# Patient Record
Sex: Female | Born: 1960 | Hispanic: Refuse to answer | Marital: Married | Smoking: Former smoker
Health system: Southern US, Community
[De-identification: ages and names within clinical notes are randomized; demographics above are authoritative.]

## PROBLEM LIST (undated history)

## (undated) DIAGNOSIS — F32A Depression, unspecified: Secondary | ICD-10-CM

## (undated) DIAGNOSIS — F329 Major depressive disorder, single episode, unspecified: Secondary | ICD-10-CM

## (undated) HISTORY — PX: TUBAL LIGATION: SHX77

## (undated) HISTORY — PX: CARPAL TUNNEL RELEASE: SHX101

## (undated) HISTORY — PX: WISDOM TOOTH EXTRACTION: SHX21

## (undated) HISTORY — DX: Depression, unspecified: F32.A

## (undated) HISTORY — DX: Major depressive disorder, single episode, unspecified: F32.9

---

## 2010-02-09 ENCOUNTER — Ambulatory Visit: Payer: Self-pay | Admitting: Family Medicine

## 2010-02-09 DIAGNOSIS — D492 Neoplasm of unspecified behavior of bone, soft tissue, and skin: Secondary | ICD-10-CM

## 2010-02-09 DIAGNOSIS — B070 Plantar wart: Secondary | ICD-10-CM

## 2010-02-09 DIAGNOSIS — D239 Other benign neoplasm of skin, unspecified: Secondary | ICD-10-CM | POA: Insufficient documentation

## 2010-02-14 ENCOUNTER — Encounter: Payer: Self-pay | Admitting: Family Medicine

## 2011-01-10 NOTE — Assessment & Plan Note (Signed)
Summary: NOV: Mole removal, cryo   Vital Signs:  Patient profile:   50 year old female Height:      65 inches Weight:      146 pounds BMI:     24.38 Temp:     98.5 degrees F oral Pulse rate:   89 / minute BP sitting:   126 / 70  (left arm) Cuff size:   regular  Vitals Entered By: Kathlene November (February 09, 2010 9:39 AM) CC: NP- bump on left cheek present for years but getting larger Is Patient Diabetic? No   Primary Care Provider:  Nani Gasser, MD  CC:  NP- bump on left cheek present for years but getting larger.  History of Present Illness: NP- bump on left cheek present for years but getting larger. Present for 2 years on er left cheek.  Occ will drain and has a odor. Occ tender.   Has 2 moles on the right side ofher face that are getting larger.She is very worried about this. Hx of previous mole bx on her breast that was benign.  no family hx of skin cancer.  No family hx of skin cancer.         Habits & Providers  Alcohol-Tobacco-Diet     Alcohol drinks/day: 0     Tobacco Status: quit     Year Quit: 15 years ago  Exercise-Depression-Behavior     Does Patient Exercise: no     STD Risk: never     Drug Use: never     Seat Belt Use: always  Current Medications (verified): 1)  Fish Oil 1000 Mg Caps (Omega-3 Fatty Acids) .... Takle One Tablet By Mouth Once A Day 2)  Multivitamins  Tabs (Multiple Vitamin) .... Take One Tablet By Mouth Once A Day 3)  Vitamin C 100 Mg Tabs (Ascorbic Acid) .... Take One Tablet By Mouth Once A Day 4)  Melatonin 5 Mg Tabs (Melatonin) .... Take One Tablet By Mouth At Bedtime  Allergies (verified): No Known Drug Allergies  Comments:  Nurse/Medical Assistant: The patient's medications and allergies were reviewed with the patient and were updated in the Medication and Allergy Lists. Kathlene November (February 09, 2010 9:42 AM)  Past History:  Past Medical History: None  Past Surgical History: Tubal ligation 98 Moed on breast  removed 2006 Hand "fiexed" 15  Family History: Father with alcoholism Aunt with colon Ca  Social History: Former Smoker Alcohol use-no Regular exercise-no Smoking Status:  quit Does Patient Exercise:  no STD Risk:  never Drug Use:  never Seat Belt Use:  always  Review of Systems       + fever/sweats/weakness, no unexplained weight loss/gain.  No vison changes.  No difficulty hearing/ringing in ears, hay fever/allergies.  No chest pain/discomfort, palpitations.  No Br lump/nipple discharge.  No cough/wheeze.  No blood in BM, nausea/vomiting/diarrhea.  No nighttime urination,+  leaking urine, no unusual vaginal bleeding, discharge (penis or vagina).  No muscle/joint pain. No rash, change in mole.  + HA, no memory loss.  No anxiety, +sleep d/o, depression.  No easy bruising/bleeding, unexplained lump   Physical Exam  General:  Well-developed,well-nourished,in no acute distress; alert,appropriate and cooperative throughout examination Head:  Normocephalic and atraumatic without obvious abnormalities. No apparent alopecia or balding. Skin:  Right side of face 0.6 x 0.7 cm brown raised smooth symmetric mole near the ear. Right below that is a 0.2 x  0.2 cm lesion that looks exactly the same. There is also  a solar lentigo that is about 0.5 x 2.0 large and is flat. On the dorum of her right hand has a 0.3 x 0.3 wart.  On the left cheek there is a soft nodule approx. 2.2 x 2.2 cm. No breaks or discoloration of the skin.  Doesn't feel cystic. Nothing on the inside of the cheek.  Psych:  Cognition and judgment appear intact. Alert and cooperative with normal attention span and concentration. No apparent delusions, illusions, hallucinations   Impression & Recommendations:  Problem # 1:  NEVUS, ATYPICAL (ICD-216.9)  Overall the lesion on her left cheek is reassuring but since it has grown quickly then I did recommend bx. Pt tolerated well and will be sent for pathology. F/U wound care reviewed.     Orders: Shave Skin Lesion 0.6-1.0cm face/ears/eyelids/nose/lips/mm (11311) Shave Skin Lesion < 0.5cm face/ears/eyelids/nose/lips/mm (11310)  Problem # 2:  NEOPLASM, SKIN, FACE (ICD-239.2)  Nodule on the left cheeck feels like a lipoma.  Doesn't feel ike a salivary gland or cyst but will refer to Dr. Arville Lime ENT for further evaluation and possibly bx or removal.   Orders: ENT Referral (ENT)  Problem # 3:  PLANTAR WART, RIGHT (ICD-078.12) Cryotherapy performed.  Orders: Cryotheraphy (1st lesion) (17000)  Complete Medication List: 1)  Fish Oil 1000 Mg Caps (Omega-3 fatty acids) .... Takle one tablet by mouth once a day 2)  Multivitamins Tabs (Multiple vitamin) .... Take one tablet by mouth once a day 3)  Vitamin C 100 Mg Tabs (Ascorbic acid) .... Take one tablet by mouth once a day 4)  Melatonin 5 Mg Tabs (Melatonin) .... Take one tablet by mouth at bedtime  Procedure Note  Mole Biopsy/Removal: Indication: changing lesion  Procedure # 1: shave biopsy    Size (in cm): 0.6 x 0.7    Region: left facial cheek     Instrument used: Double blade    Anesthesia: 1% lidocaine w/epinephrine  Procedure # 2: shave biopsy    Size (in cm): 0.2 x 0.2    Region: left facial cheek.     Instrument used: double blade     Anesthesia: 1% lidocaine w/epinephrine  Cleaned and prepped with: alcohol and betadine Wound dressing: bacitracin Additional Instructions: Aluminum chloride used for hemostasis.   Wart Removal: Indication: suspicious lesion  Procedure # 1: cryotherapy    Region: right dorsum of hand.     Comment: Pt tolerated well.

## 2011-01-10 NOTE — Consult Note (Signed)
Summary: [facility]  [facility]   Imported By: Lanelle Bal 06/09/2010 08:29:44  _____________________________________________________________________  External Attachment:    Type:   Image     Comment:   External Document

## 2018-03-08 ENCOUNTER — Ambulatory Visit (INDEPENDENT_AMBULATORY_CARE_PROVIDER_SITE_OTHER): Payer: BLUE CROSS/BLUE SHIELD

## 2018-03-08 ENCOUNTER — Ambulatory Visit (INDEPENDENT_AMBULATORY_CARE_PROVIDER_SITE_OTHER): Payer: BLUE CROSS/BLUE SHIELD | Admitting: Family Medicine

## 2018-03-08 ENCOUNTER — Encounter: Payer: Self-pay | Admitting: Family Medicine

## 2018-03-08 VITALS — BP 125/73 | HR 86 | Wt 130.0 lb

## 2018-03-08 DIAGNOSIS — R519 Headache, unspecified: Secondary | ICD-10-CM | POA: Insufficient documentation

## 2018-03-08 DIAGNOSIS — G43909 Migraine, unspecified, not intractable, without status migrainosus: Secondary | ICD-10-CM | POA: Diagnosis not present

## 2018-03-08 DIAGNOSIS — M542 Cervicalgia: Secondary | ICD-10-CM

## 2018-03-08 DIAGNOSIS — R51 Headache: Secondary | ICD-10-CM

## 2018-03-08 DIAGNOSIS — F4321 Adjustment disorder with depressed mood: Secondary | ICD-10-CM

## 2018-03-08 MED ORDER — FLUTICASONE PROPIONATE 50 MCG/ACT NA SUSP: 2.0000 | Freq: Every day | NASAL | 6 refills | Status: AC

## 2018-03-08 MED ORDER — PREDNISONE 10 MG PO TABS: ORAL_TABLET | ORAL | 0 refills | Status: DC

## 2018-03-08 MED ORDER — TOPIRAMATE 25 MG PO TABS: ORAL_TABLET | ORAL | 0 refills | Status: AC

## 2018-03-08 MED ORDER — RIZATRIPTAN BENZOATE 10 MG PO TBDP: 10.0000 mg | ORAL_TABLET | ORAL | 1 refills | Status: AC | PRN

## 2018-03-08 NOTE — Patient Instructions (Signed)
Migraine Headache A migraine headache is a very strong throbbing pain on one side or both sides of your head. Migraines can also cause other symptoms. Talk with your doctor about what things may bring on (trigger) your migraine headaches. Follow these instructions at home: Medicines  Take over-the-counter and prescription medicines only as told by your doctor.  Do not drive or use heavy machinery while taking prescription pain medicine.  To prevent or treat constipation while you are taking prescription pain medicine, your doctor may recommend that you: ? Drink enough fluid to keep your pee (urine) clear or pale yellow. ? Take over-the-counter or prescription medicines. ? Eat foods that are high in fiber. These include fresh fruits and vegetables, whole grains, and beans. ? Limit foods that are high in fat and processed sugars. These include fried and sweet foods. Lifestyle  Avoid alcohol.  Do not use any products that contain nicotine or tobacco, such as cigarettes and e-cigarettes. If you need help quitting, ask your doctor.  Get at least 8 hours of sleep every night.  Limit your stress. General instructions   Keep a journal to find out what may bring on your migraines. For example, write down: ? What you eat and drink. ? How much sleep you get. ? Any change in what you eat or drink. ? Any change in your medicines.  If you have a migraine: ? Avoid things that make your symptoms worse, such as bright lights. ? It may help to lie down in a dark, quiet room. ? Do not drive or use heavy machinery. ? Ask your doctor what activities are safe for you.  Keep all follow-up visits as told by your doctor. This is important. Contact a doctor if:  You get a migraine that is different or worse than your usual migraines. Get help right away if:  Your migraine gets very bad.  You have a fever.  You have a stiff neck.  You have trouble seeing.  Your muscles feel weak or like you  cannot control them.  You start to lose your balance a lot.  You start to have trouble walking.  You pass out (faint). This information is not intended to replace advice given to you by your health care provider. Make sure you discuss any questions you have with your health care provider. Document Released: 09/05/2008 Document Revised: 06/16/2016 Document Reviewed: 05/15/2016 Elsevier Interactive Patient Education  2018 [employer] Occipital Neuralgia Occipital neuralgia is a type of headache that causes episodes of very bad pain in the back of your head. Pain from occipital neuralgia may spread (radiate) to other parts of your head. The pain is usually brief and often goes away after you rest and relax. These headaches may be caused by irritation of the nerves that leave your spinal cord high up in your neck, just below the base of your skull (occipital nerves). Your occipital nerves transmit sensations from the back of your head, the top of your head, and the areas behind your ears. What are the causes? Occipital neuralgia can occur without any known cause (primary headache syndrome). In other cases, occipital neuralgia is caused by pressure on or irritation of one of the two occipital nerves. Causes of occipital nerve compression or irritation include:  Wear and tear of the vertebrae in the neck (osteoarthritis).  Neck injury.  Disease of the disks that separate the vertebrae.  Tumors.  Gout.  Infections.  Diabetes.  Swollen blood vessels that put pressure on the   occipital nerves.  Muscle spasm in the neck.  What are the signs or symptoms? Pain is the main symptom of occipital neuralgia. It usually starts in the back of the head but may also be felt in other areas supplied by the occipital nerves. Pain is usually on one side but may be on both sides. You may have:  Brief episodes of very bad pain that is burning, stabbing, shocking, or shooting.  Pain behind the  eye.  Pain triggered by neck movement or hair brushing.  Scalp tenderness.  Aching in the back of the head between episodes of very bad pain.  How is this diagnosed? Your health care provider may diagnose occipital neuralgia based on your symptoms and a physical exam. During the exam, the health care provider may push on areas supplied by the occipital nerves to see if they are painful. Some tests may also be done to help in making the diagnosis. These may include:  Imaging studies of the upper spinal cord, such as an MRI or CT scan. These may show compression or spinal cord abnormalities.  Nerve block. You will get an injection of numbing medicine (local anesthetic) near the occipital nerve to see if this relieves pain.  How is this treated? Treatment may begin with simple measures, such as:  Rest.  Massage.  Heat.  Over-the-counter pain relievers.  If these measures do not work, you may need other treatments, including:  Medicines such as: ? Prescription-strength anti-inflammatory medicines. ? Muscle relaxants. ? Antiseizure medicines. ? Antidepressants.  Steroid injection. This involves injections of local anesthetic and strong anti-inflammatory drugs (steroids).  Pulsed radiofrequency. Wires are implanted to deliver electrical impulses that block pain signals from the occipital nerve.  Physical therapy.  Surgery to relieve nerve pressure.  Follow these instructions at home:  Take all medicines as directed by your health care provider.  Avoid activities that cause pain.  Rest when you have an attack of pain.  Try gentle massage or a heating pad to relieve pain.  Work with a physical therapist to learn stretching exercises you can do at home.  Try a different pillow or sleeping position.  Practice good posture.  Try to stay active. Get regular exercise that does not cause pain. Ask your health care provider to suggest safe exercises for you.  Keep all  follow-up visits as directed by your health care provider. This is important. Contact a health care provider if:  Your medicine is not working.  You have new or worsening symptoms. Get help right away if:  You have very bad head pain that is not going away.  You have a sudden change in vision, balance, or speech. This information is not intended to replace advice given to you by your health care provider. Make sure you discuss any questions you have with your health care provider. Document Released: 11/21/2001 Document Revised: 05/04/2016 Document Reviewed: 11/19/2013 Elsevier Interactive Patient Education  2017 Elsevier Inc.  

## 2018-03-08 NOTE — Progress Notes (Signed)
Subjective:    Patient ID: Julie Pitts, female    DOB: May 12, 1961, 57 y.o.   MRN: [id number]  HPI 57 year old female comes in today to reestablish care.  I have not seen her since 2011.  But she also has some specific concerns going on including headaches and dizziness.  Julie Pitts is a 57 year old female who complains that she has had severe headaches for about 7 years.  She never really had any major problems with this before hand but around that time started having a lot of persistent and frequent headaches.  Some of them are more low-grade and more frequent and others are severe to the point that she gets light sensitivity, sound sensitivity and actually vomits.  She says she almost gets dizzy with them and has difficulty walking when they are that severe.  She stated she initially went to a dentist when they first started and thought it was coming from impaction of her wisdom teeth.  She had those removed and did not get any relief with her headaches.  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  She said she finally got to the point where the chiropractor would no longer treat her because he was afraid that he was going to cause more harm.  She frequently takes an over-the-counter Advil allergy and sinus tab.  The last severe headache that she had with vomiting was actually yesterday.  She says at this point she just feels overwhelmed and frustrated.  She feels sad and down about it and at times even hopeless.  Is really starting to impact her emotionally.  She is here with her husband today.  She also reports significant difficulty with sleep.   Review of Systems  Constitutional: Negative for diaphoresis, fever and  unexpected weight change.  HENT: Negative for hearing loss, postnasal drip, sneezing and tinnitus.   Eyes: Negative for visual disturbance.  Respiratory: Negative for cough and wheezing.   Cardiovascular: Negative for chest pain and palpitations.  Gastrointestinal: Positive for nausea and vomiting.  Genitourinary: Negative for vaginal bleeding and vaginal discharge.  Musculoskeletal: Negative for arthralgias.  Neurological: Positive for dizziness and headaches.       + headaches.   Hematological: Negative for adenopathy. Does not bruise/bleed easily.     BP 125/73   Pulse 86   Wt 130 lb (59 kg)   BMI 21.63 kg/m     No Known Allergies  Past Medical History:  Diagnosis Date  . Depression     Past Surgical History:  Procedure Laterality Date  . CARPAL TUNNEL RELEASE    . TUBAL LIGATION    . WISDOM TOOTH EXTRACTION      Social History   Socioeconomic History  . Marital status: Married    Spouse name: Kellene Mccleary  . Number of children: 1  . Years of education: 41  . Highest education level: High school graduate  Occupational History  . Occupation: stay at home  Social Needs  . Financial resource strain: Not on file  . Food insecurity:    Worry: Not on file    Inability: Not on file  . Transportation needs:    Medical: Not on file    Non-medical:  Not on file  Tobacco Use  . Smoking status: Former Smoker    Packs/day: 1.00    Years: 20.00    Pack years: 20.00    Types: Cigarettes    Last attempt to quit: 03/08/1998    Years since quitting: 20.0  . Smokeless tobacco: Never Used  Substance and Sexual Activity  . Alcohol use: Never    Frequency: Never  . Drug use: Never  . Sexual activity: Not Currently    Partners: Male    Birth control/protection: Surgical  Lifestyle  . Physical activity:    Days per week: Not on file    Minutes per session: Not on file  . Stress: Not on file  Relationships  . Social connections:    Talks on phone: Not on file     Gets together: Not on file    Attends religious service: Not on file    Active member of club or organization: Not on file    Attends meetings of clubs or organizations: Not on file    Relationship status: Not on file  . Intimate partner violence:    Fear of current or ex partner: Not on file    Emotionally abused: Not on file    Physically abused: Not on file    Forced sexual activity: Not on file  Other Topics Concern  . Not on file  Social History Narrative  . Not on file    Family History  Problem Relation Age of Onset  . Colon cancer Mother     Outpatient Encounter Medications as of 03/08/2018  Medication Sig  . fluticasone (FLONASE) 50 MCG/ACT nasal spray Place 2 sprays into both nostrils daily.  . predniSONE (DELTASONE) 10 MG tablet 8 tabs po Day 1, 6 tabs Day 2, 4 tabs Day 3, 2 tabs Day , 1 tab Day 5  . rizatriptan (MAXALT-MLT) 10 MG disintegrating tablet Take 1 tablet (10 mg total) by mouth as needed for migraine. May repeat in 2 hours if needed  . topiramate (TOPAMAX) 25 MG tablet 25 mg po QHS x 1 week, then increase to BID x 1 week, then 2 in AM and 1 QHS x 1 week, then 2 tabs po BID   No facility-administered encounter medications on file as of 03/08/2018.          Objective:   Physical Exam  Constitutional: She is oriented to person, place, and time. She appears well-developed and well-nourished.  HENT:  Head: Normocephalic and atraumatic.  Right Ear: External ear normal.  Left Ear: External ear normal.  Nose: Nose normal.  Mouth/Throat: Oropharynx is clear and moist.  TMs and canals are clear.   Eyes: Pupils are equal, round, and reactive to light. Conjunctivae and EOM are normal.  Neck: Neck supple. No thyromegaly present.  Cardiovascular: Normal rate, regular rhythm and normal heart sounds.  Pulmonary/Chest: Effort normal and breath sounds normal. She has no wheezes.  Musculoskeletal:  Normal cervical flexion, some decreased extension.  She is able to  rotate right and left but it was painful.  She is also able to side bend right and left but that was also painful.  Lymphadenopathy:    She has no cervical adenopathy.  Neurological: She is alert and oriented to person, place, and time. No cranial nerve deficit.  Skin: Skin is warm and dry.  Psychiatric: She has a normal mood and affect. Her behavior is normal.          Assessment & Plan:  Headaches-I do feel like she has 2 different types of headaches going on.  I feel like she has migraine headaches which are the more severe headaches with vomiting and light sensitivity and sound sensitivity.  She moves all at the criteria for diagnosis.  I think she will benefit from starting on a prophylaxis in addition to having a rescue medication we discussed this today.  I am going to start her on topiramate since she tends to have low blood pressure.  And will give her Maxalt for rescue.  I also feel that she has chronic daily headaches as well some heavy with topiramate will help with that as well but we will need to monitor that.  We also discussed the importance of stopping some of the over-the-counter medications and says the sinus allergy medicine which she takes very frequently in fact almost every day.  I discussed that this can often trigger a rebound phenomenon and that she will have to stop taking that medication.  In the interim I put her on a prednisone taper for 5 days to try to break her current headache cycle that she is in.  We also discussed further evaluation with brain MRI since it is a little unusual to start having severe and frequent headaches postmenopausally.  Cervical pain-recommend starting with an x-ray to evaluate for degenerative disc disease as well as some significant arthritis on exam.  We may need to evaluate further but I think this will be a great start.  She would likely benefit from some physical therapy.  Positive depression screen-PHQ 9 score of 17 and gad 7 score of  16.  She did mark that she was having thoughts of being better off dead but denies being actively suicidal.  She says she really just feeling overwhelmed with the headaches and really wants help.

## 2018-03-09 LAB — CBC WITH DIFFERENTIAL/PLATELET
BASOS PCT: 0.8 %
Basophils Absolute: 38 cells/uL (ref 0–200)
EOS ABS: 72 {cells}/uL (ref 15–500)
Eosinophils Relative: 1.5 %
HCT: 41.9 % (ref 35.0–45.0)
Hemoglobin: 14.8 g/dL (ref 11.7–15.5)
Lymphs Abs: 2362 cells/uL (ref 850–3900)
MCH: 32.5 pg (ref 27.0–33.0)
MCHC: 35.3 g/dL (ref 32.0–36.0)
MCV: 92.1 fL (ref 80.0–100.0)
MPV: 10.3 fL (ref 7.5–12.5)
Monocytes Relative: 12.4 %
Neutro Abs: 1733 cells/uL (ref 1500–7800)
Neutrophils Relative %: 36.1 %
PLATELETS: 265 10*3/uL (ref 140–400)
RBC: 4.55 10*6/uL (ref 3.80–5.10)
RDW: 12.2 % (ref 11.0–15.0)
Total Lymphocyte: 49.2 %
WBC mixed population: 595 cells/uL (ref 200–950)
WBC: 4.8 10*3/uL (ref 3.8–10.8)

## 2018-03-09 LAB — COMPLETE METABOLIC PANEL WITH GFR
AG Ratio: 2 (calc) (ref 1.0–2.5)
ALBUMIN MSPROF: 4.9 g/dL (ref 3.6–5.1)
ALKALINE PHOSPHATASE (APISO): 70 U/L (ref 33–130)
ALT: 29 U/L (ref 6–29)
AST: 32 U/L (ref 10–35)
BUN / CREAT RATIO: 40 (calc) — AB (ref 6–22)
BUN: 27 mg/dL — AB (ref 7–25)
CALCIUM: 10.3 mg/dL (ref 8.6–10.4)
CO2: 31 mmol/L (ref 20–32)
CREATININE: 0.68 mg/dL (ref 0.50–1.05)
Chloride: 103 mmol/L (ref 98–110)
GFR, EST NON AFRICAN AMERICAN: 96 mL/min/{1.73_m2} (ref >=60)
GFR, Est African American: 112 mL/min/{1.73_m2} (ref >=60)
GLUCOSE: 97 mg/dL (ref 65–99)
Globulin: 2.4 g/dL (calc) (ref 1.9–3.7)
Potassium: 3.7 mmol/L (ref 3.5–5.3)
Sodium: 141 mmol/L (ref 135–146)
Total Bilirubin: 0.4 mg/dL (ref 0.2–1.2)
Total Protein: 7.3 g/dL (ref 6.1–8.1)

## 2018-03-09 LAB — SEDIMENTATION RATE: Sed Rate: 14 mm/h (ref 0–30)

## 2018-03-09 LAB — LIPID PANEL
CHOL/HDL RATIO: 4.1 (calc) (ref <5.0)
CHOLESTEROL: 238 mg/dL — AB (ref <200)
HDL: 58 mg/dL (ref >50)
LDL CHOLESTEROL (CALC): 156 mg/dL — AB
Non-HDL Cholesterol (Calc): 180 mg/dL (calc) — ABNORMAL HIGH (ref <130)
Triglycerides: 122 mg/dL (ref <150)

## 2018-03-09 LAB — TSH: TSH: 1.34 mIU/L (ref 0.40–4.50)

## 2018-03-12 ENCOUNTER — Encounter: Payer: Self-pay | Admitting: Sports Medicine

## 2018-03-12 ENCOUNTER — Ambulatory Visit (INDEPENDENT_AMBULATORY_CARE_PROVIDER_SITE_OTHER): Payer: BLUE CROSS/BLUE SHIELD | Admitting: Sports Medicine

## 2018-03-12 DIAGNOSIS — M4722 Other spondylosis with radiculopathy, cervical region: Secondary | ICD-10-CM | POA: Diagnosis not present

## 2018-03-12 DIAGNOSIS — M47812 Spondylosis without myelopathy or radiculopathy, cervical region: Secondary | ICD-10-CM | POA: Insufficient documentation

## 2018-03-12 MED ORDER — MELOXICAM 15 MG PO TABS: ORAL_TABLET | ORAL | 3 refills | Status: AC

## 2018-03-12 MED ORDER — KETOROLAC TROMETHAMINE 30 MG/ML IJ SOLN
30.0000 mg | Freq: Once | INTRAMUSCULAR | Status: AC
  Administered 2018-03-12: 30 mg via INTRAMUSCULAR

## 2018-03-12 MED ORDER — METHYLPREDNISOLONE SODIUM SUCC 125 MG IJ SOLR
125.0000 mg | Freq: Once | INTRAMUSCULAR | Status: AC
  Administered 2018-03-12: 125 mg via INTRAMUSCULAR

## 2018-03-12 MED ORDER — CYCLOBENZAPRINE HCL 10 MG PO TABS: ORAL_TABLET | ORAL | 0 refills | Status: AC

## 2018-03-12 NOTE — Assessment & Plan Note (Signed)
With left-sided C7 radiculitis. Formal physical therapy, Flexeril, meloxicam. Toradol 30, Solu-Medrol 125 intramuscular.  Return in one month, MRI for interventional planning if no better.

## 2018-03-12 NOTE — Progress Notes (Signed)
.   Subjective:    I'm seeing this patient as a consultation for:  Dr. Beatrice Lecher  CC: worsening migraine pain  HPI:  Julie Pitts is 57yo woman with a pmh significant for 7 years of migraine pain for which she managed with chiropractic maneuvers for the first 5 years. Pt also reports unsuccessful  use of acupuncture to alleviate the pain during the same time period.  Despite chiropractic management, frequency and intensity of the migraine pain increased. Pt reports that in the last month she has spent approximately 1 day without pain.   In the last week, pt endorses accompanying nausea, vomiting, dizziness,  and throbbing pain over the maxillary, frontal  and ethmoid sinus. In the last week, pt also reports cold-like symptoms (congestion) for which  She saw her pcp and received treatment (nasal corticosteroid).  Pt also reports aching/throbing pain over the lower cervical spine, that radiates to the the  left shoulder, and up to the external occipital protuberance. Pt reports a general aversion to being on medication, and that the migraine medication she was prescribed by her PCP has not brought relief. Pt reports that the pain has kept her from sleeping.    I reviewed the past medical history, family history, social history, surgical history, and allergies today and no changes were needed.  Please see the problem list section below in epic for further details.  Past Medical History: Past Medical History:  Diagnosis Date  . Depression    Past Surgical History: Past Surgical History:  Procedure Laterality Date  . CARPAL TUNNEL RELEASE    . TUBAL LIGATION    . WISDOM TOOTH EXTRACTION     Social History: Social History   Socioeconomic History  . Marital status: Married    Spouse name: Shanan Fitzpatrick  . Number of children: 1  . Years of education: 27  . Highest education level: High school graduate  Occupational History  . Occupation: stay at home  Social Needs  . Financial  resource strain: Not on file  . Food insecurity:    Worry: Not on file    Inability: Not on file  . Transportation needs:    Medical: Not on file    Non-medical: Not on file  Tobacco Use  . Smoking status: Former Smoker    Packs/day: 1.00    Years: 20.00    Pack years: 20.00    Types: Cigarettes    Last attempt to quit: 03/08/1998    Years since quitting: 20.0  . Smokeless tobacco: Never Used  Substance and Sexual Activity  . Alcohol use: Never    Frequency: Never  . Drug use: Never  . Sexual activity: Not Currently    Partners: Male    Birth control/protection: Surgical  Lifestyle  . Physical activity:    Days per week: Not on file    Minutes per session: Not on file  . Stress: Not on file  Relationships  . Social connections:    Talks on phone: Not on file    Gets together: Not on file    Attends religious service: Not on file    Active member of club or organization: Not on file    Attends meetings of clubs or organizations: Not on file    Relationship status: Not on file  Other Topics Concern  . Not on file  Social History Narrative  . Not on file   Family History: Family History  Problem Relation Age of Onset  . Colon  cancer Mother    Allergies: No Known Allergies Medications: Pt endorses taking ma multi vitamin tablet, Vitamin C, fish oil, and melatonin regularly.  Also See med rec.  Review of Systems:  No visual changes, diarrhea, constipation, dizziness, abdominal pain, skin rash, fevers, chills, night sweats, weight loss, swollen lymph nodes, body aches, joint swelling, chest pain, shortness of breath, mood changes, visual or auditory hallucinations.   Objective:   General: Well Developed, well nourished, and in no acute distress.  Neuro:  Extra-ocular muscles intact, able to move all 4 extremities, sensation grossly intact.  Deep tendon reflexes tested were normal. Psych: Alert and oriented, mood congruent with affect. ENT:  Ears and nose appear  unremarkable.  Hearing grossly normal. Neck: Unremarkable overall appearance, trachea midline.  No visible thyroid enlargement. Eyes: Conjunctivae and lids appear unremarkable.  Pupils equal and round. Skin: Warm and dry, no rashes noted.  Cardiovascular: Pulses palpable, no extremity edema.  Neck: Inspection unremarkable. No palpable step off Positive Left Spurling's maneuver. Full neck range of motion with pain during lateral bending, and rotation Grip strength and sensation normal in bilateral hands Strength diminished C4 to T1 distribution No sensory change to C4 to T1 Negative Hoffman sign bilaterally Reflexes normal in triceps, biceps, and brachioradialis bilaterally.   Pt is actively experiencing migraine pain and has point tenderness reproducible with palpation at the lower cervical spine that radiates to the shoulders (left more than right), and  external occipital protuberance.    Impression and Recommendations:   This case required medical decision making of moderate complexity.    Assessment: Cervical Radiculopathy 1. Corticosteroid/NSAID (Toradol-30; Solu-Medrol 125) intramuscular injection for immediate relief given in office; 2.  Muscle relaxer (Flexeril) prescribed, for use nightly, to relieve  symptoms before sleeping;   3. Anti-Inflammatory (Meloxican) taken daily, to alleviate persistent inflammation/aggravation of area surrounding  cervical spine, and  4. Pt was encouraged to engage in nightly physical/rehabilitative  therapy for long term alleviation of cervical pain and to maintain range of motion.    Pt was counseled to follow up in one month if pain has not improved. Pt also counseled about the use of MRI to further evaluate cervical pain if the pain has not improved in one month.  Assessment: Migraine Pt counseled to continue taking migraine medication to manage headaches.  ___________________________________________ Gwen Her. Dianah Field, M.D., ABFM.,  CAQSM. Primary Care and [place] Instructor of [place] [facility]

## 2018-03-21 ENCOUNTER — Ambulatory Visit (INDEPENDENT_AMBULATORY_CARE_PROVIDER_SITE_OTHER): Payer: BLUE CROSS/BLUE SHIELD | Admitting: Physical Therapy

## 2018-03-21 ENCOUNTER — Encounter: Payer: Self-pay | Admitting: Physical Therapy

## 2018-03-21 DIAGNOSIS — M5412 Radiculopathy, cervical region: Secondary | ICD-10-CM

## 2018-03-21 DIAGNOSIS — M62838 Other muscle spasm: Secondary | ICD-10-CM | POA: Diagnosis not present

## 2018-03-21 DIAGNOSIS — R293 Abnormal posture: Secondary | ICD-10-CM | POA: Diagnosis not present

## 2018-03-21 NOTE — Patient Instructions (Addendum)
Head Press With [place] chin SLIGHTLY toward chest, keep mouth closed. Feel weight on back of head. Increase weight by pressing head down. Hold _2-3__ seconds. Relax. Repeat _5-10__ times. Once a day.  Surface: floor   Shoulder Press    Press both shoulders down. Hold __2-3_ seconds. Repeat _5-10__ times. Press one shoulder down. Hold _2-3__ seconds Repeat _5-10__ times. Do other shoulder. If unable to press one or both shoulders, lie in position a few sessions until you can. Once a day.  Surface: floor   Thoracic Self-Mobilization Stretch (Supine)    With small rolled towel at lower ribs level, gently lie back until stretch is felt. Hold __30__ seconds. Relax. Repeat __1__ times per set. Do __1__ sets per session. Do ____ sessions per day. Scoot bottom down two - three times.    Trigger Point Dry Needling  . What is Trigger Point Dry Needling (DN)? o DN is a physical therapy technique used to treat muscle pain and dysfunction. Specifically, DN helps deactivate muscle trigger points (muscle knots).  o A thin filiform needle is used to penetrate the skin and stimulate the underlying trigger point. The goal is for a local twitch response (LTR) to occur and for the trigger point to relax. No medication of any kind is injected during the procedure.   . What Does Trigger Point Dry Needling Feel Like?  o The procedure feels different for each individual patient. Some patients report that they do not actually feel the needle enter the skin and overall the process is not painful. Very mild bleeding may occur. However, many patients feel a deep cramping in the muscle in which the needle was inserted. This is the local twitch response.   Marland Kitchen How Will I feel after the treatment? o Soreness is normal, and the onset of soreness may not occur for a few hours. Typically this soreness does not last longer than two days.  o Bruising is uncommon, however; ice can be used to decrease any possible  bruising.  o In rare cases feeling tired or nauseous after the treatment is normal. In addition, your symptoms may get worse before they get better, this period will typically not last longer than 24 hours.   . What Can I do After My Treatment? o Increase your hydration by drinking more water for the next 24 hours. o You may place ice or heat on the areas treated that have become sore, however, do not use heat on inflamed or bruised areas. Heat often brings more relief post needling. o You can continue your regular activities, but vigorous activity is not recommended initially after the treatment for 24 hours. o DN is best combined with other physical therapy such as strengthening, stretching, and other therapies.   TENS UNIT: This is helpful for muscle pain and spasm.   Search and Purchase a TENS 7000 2nd edition at www.tenspros.com. It should be less than $30.     TENS unit instructions: Do not shower or bathe with the unit on Turn the unit off before removing electrodes or batteries If the electrodes lose stickiness add a drop of water to the electrodes after they are disconnected from the unit and place on plastic sheet. If you continued to have difficulty, call the TENS unit company to purchase more electrodes. Do not apply lotion on the skin area prior to use. Make sure the skin is clean and dry as this will help prolong the life of the electrodes.  After use, always check skin for unusual red areas, rash or other skin difficulties. If there are any skin problems, does not apply electrodes to the same area. Never remove the electrodes from the unit by pulling the wires. Do not use the TENS unit or electrodes other than as directed. Do not change electrode placement without consultating your therapist or physician. Keep 2 fingers with between each electrode. Wear time ratio is 2:1, on to off times.    For example on for 30 minutes off for 15 minutes and then on for 30 minutes off for  15 minutes

## 2018-03-21 NOTE — Therapy (Addendum)
[facility] [address] [place], [place], [address] Phone: [phone]   Fax:  [phone]  Physical Therapy Evaluation  Patient Details  Name: Julie Pitts MRN: [id number] Date of Birth: 08/10/1961 Referring Provider: Dr Dianah Field   Encounter Date: 03/21/2018  PT End of Session - 03/21/18 1146    Visit Number  1    Number of Visits  6    Date for PT Re-Evaluation  05/02/18    PT Start Time  1148    PT Stop Time  1251    PT Time Calculation (min)  63 min    Activity Tolerance  Patient tolerated treatment well       Past Medical History:  Diagnosis Date  . Depression     Past Surgical History:  Procedure Laterality Date  . CARPAL TUNNEL RELEASE    . TUBAL LIGATION    . WISDOM TOOTH EXTRACTION      There were no vitals filed for this visit.   Subjective Assessment - 03/21/18 1148    Subjective  Pt reports she has had HA for about 7 yrs, was seen by a chiropractor and stopped about 2 yrs ago without help. Has been continuing with her streches.  Also failed acupunture.  Currenlty has constant headache., Starting the the mid thoracic area and moves up her body into the head. Today is left side however it moves around.     Diagnostic tests  x-rays negative, cervical spondylosis.     Patient Stated Goals  reduce the pain/headaches    Currently in Pain?  Yes    Pain Score  2     Pain Location  Head    Pain Orientation  Left    Pain Descriptors / Indicators  Pressure feels like the muscle  ( upper traps) are trying to pinch her head off    Pain Type  Chronic pain    Pain Onset  More than a month ago    Pain Frequency  Constant    Aggravating Factors   nothing it is constant    Pain Relieving Factors  ice         [facility] PT Assessment - 03/21/18 0001      Assessment   Medical Diagnosis  Cervical radiculopahty    Referring Provider  Dr Dianah Field    Onset Date/Surgical Date  03/22/11    Hand Dominance   Right    Next MD Visit  unsure    Prior Therapy  no physical therapy      Precautions   Precautions  None      Balance Screen   Has the patient fallen in the past 6 months  No      Prior Function   Level of Independence  Independent    Vocation  Unemployed due to pain, was a landscaper      Observation/Other Assessments   Focus on Therapeutic Outcomes (FOTO)   79% limited      Posture/Postural Control   Posture/Postural Control  Postural limitations    Postural Limitations  Rounded Shoulders;Forward head;Increased lumbar lordosis      ROM / Strength   AROM / PROM / Strength  AROM;Strength      AROM   AROM Assessment Site  Shoulder;Cervical    Right/Left Shoulder  -- WNL with tightness in the neck    Cervical Flexion  59    Cervical Extension  35 with tightness    Cervical - Right Side Bend  30    Cervical - Left Side Bend  26    Cervical - Right Rotation  62    Cervical - Left Rotation  45      Strength   Strength Assessment Site  Shoulder;Elbow    Right/Left Shoulder  -- WNL    Right/Left Elbow  -- WNL      Palpation   Spinal mobility  hypomobile in cervical and thoracic spine.  Pt reports tightness in cervical and pain in thoracic area.     Palpation comment  tight in bilat upper traps, cervical paraspinals  and peri occiput                Objective measurements completed on examination: See above findings.      [facility] Adult PT Treatment/Exercise - 03/21/18 0001      Exercises   Exercises  Other Exercises    Other Exercises   attempted head presses into pillow - pt reports to painful, performed 10 reps shoulder presses then thoracic openers over a pool noodle.       Modalities   Modalities  Electrical Stimulation;Moist Heat      Moist Heat Therapy   Number Minutes Moist Heat  20 Minutes    Moist Heat Location  Cervical      Electrical Stimulation   Electrical Stimulation Location  cervical     Electrical Stimulation Action  IFC    Electrical  Stimulation Parameters   to tolerance    Electrical Stimulation Goals  Tone;Pain      Manual Therapy   Manual Therapy  Soft tissue mobilization    Soft tissue mobilization  STM to cervical paraspinals and perioccipital muscles.        Trigger Point Dry Needling - 03/21/18 1253    Consent Given?  Yes    Education Handout Provided  Yes    Muscles Treated Upper Body  Longissimus;Oblique capitus    Oblique Capitus Response  -- attempted on Lt side, pt unable to tolerate    Longissimus Response  Palpable increased muscle length;Twitch response elicited bilat cervical paraspinals.            PT Education - 03/21/18 1228    Education provided  Yes    Education Details  HEP, TENS & DN    Person(s) Educated  Patient    Methods  Explanation;Demonstration;Handout    Comprehension  Returned demonstration;Verbalized understanding          PT Long Term Goals - 03/21/18 1247      PT LONG TERM GOAL #1   Title  I with advanced HEP ( 05/02/18)     Time  6    Period  Weeks    Status  New      PT LONG TERM GOAL #2   Title  improve cervical ROM to WNL without feelings of tightness  ( 05/02/18)     Time  6    Period  Weeks    Status  New      PT LONG TERM GOAL #3   Title  improve FOTO =/< 60% limited ( 05/02/18)     Time  6    Period  Weeks    Status  New      PT LONG TERM GOAL #4   Title  report =/> 75% reduction in headaches ( 05/02/18)     Time  6    Period  Weeks    Status  New  PT LONG TERM GOAL #5   Title  report ability to sleep several hours without waking due to pain ( 05/02/18)     Time  6    Period  Weeks    Status  New             Plan - 03/21/18 1240    Clinical Impression Statement  57 yo female with yrs long h/o HA, neck and thoracic pain.  She has failed chiropractic care and has never tried PT.  [name] has a lot of tightness in her cervical musculature, hypomobile through the C & T spine.  She also has postural changes effecting the muscles and  spine.  Overall her stength if good.  She should do well with manual work/DN and postural re-ed.     Clinical Presentation  Evolving    Clinical Decision Making  Moderate    Rehab Potential  Excellent    PT Frequency  2x / week    PT Duration  4 weeks    PT Treatment/Interventions  Iontophoresis 84m/ml Dexamethasone;Neuromuscular re-education;Dry needling;Manual techniques;Moist Heat;Traction;Therapeutic activities;Patient/family education;Taping;Therapeutic exercise;Cryotherapy;Electrical Stimulation;Passive range of motion    PT Next Visit Plan  assess response to DN, continue with this and manual work/spinal mobs.  Postural re-ed exercises.     Consulted and Agree with Plan of Care  Patient       Patient will benefit from skilled therapeutic intervention in order to improve the following deficits and impairments:  Pain, Improper body mechanics, Postural dysfunction, Increased muscle spasms, Decreased strength, Decreased range of motion, Impaired UE functional use  Visit Diagnosis: Radiculopathy, cervical region - Plan: PT plan of care cert/re-cert  Other muscle spasm - Plan: PT plan of care cert/re-cert  Abnormal posture - Plan: PT plan of care cert/re-cert     Problem List Patient Active Problem List   Diagnosis Date Noted  . Cervical spondylosis 03/12/2018  . Chronic daily headache 03/08/2018  . Severe headache 03/08/2018  . Cervical pain 03/08/2018  . Situational depression 03/08/2018  . PLANTAR WART, RIGHT 02/09/2010  . NEVUS, ATYPICAL 02/09/2010  . NEOPLASM, SKIN, FACE 02/09/2010    SJeral PinchPT  03/21/2018, 12:55 PM  [facility] [place] [address] [address]Phone: [phone]  Fax:  [phone] Name: DAnahis FurgesonMRN: [id number] of Birth: 7May 02, 1962  PHYSICAL THERAPY DISCHARGE SUMMARY  Visits from Start of Care: 1  Current functional level related to goals / functional  outcomes: Unknown, patient reports she doesn't want to come to therapy because she is " not feeling it"   Remaining deficits: unknown   Education / Equipment: Initial HEP , DN Plan: Patient agrees to discharge.  Patient goals were not met. Patient is being discharged due to the patient's request.  ?????    SJeral Pinch PT 04/23/18 9:42 AM

## 2018-03-26 ENCOUNTER — Encounter: Payer: BLUE CROSS/BLUE SHIELD | Admitting: Physical Therapy

## 2018-03-27 ENCOUNTER — Ambulatory Visit: Payer: BLUE CROSS/BLUE SHIELD | Admitting: Family Medicine

## 2018-04-02 ENCOUNTER — Encounter: Payer: BLUE CROSS/BLUE SHIELD | Admitting: Physical Therapy

## 2019-10-01 IMAGING — DX DG CERVICAL SPINE COMPLETE 4+V
6 series · 6 of 6 positions shown · non-contrast
Comparison: None.

CLINICAL DATA: Chronic migraines.

EXAM:
CERVICAL SPINE - COMPLETE 4+ VIEW

[c-spine lat]
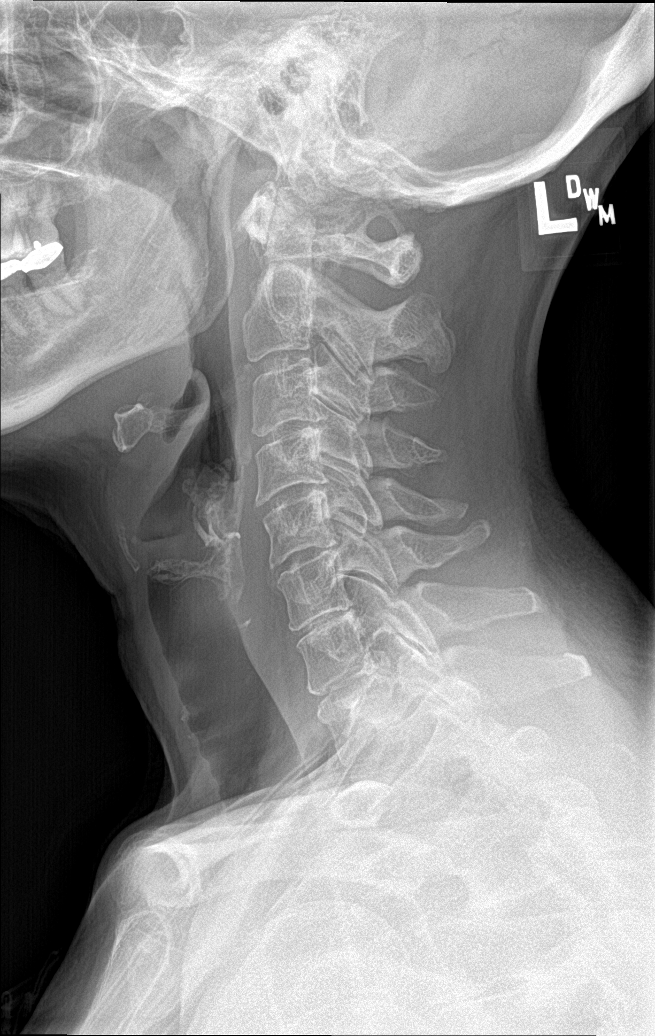

[c-spine obl (1 of 2)]
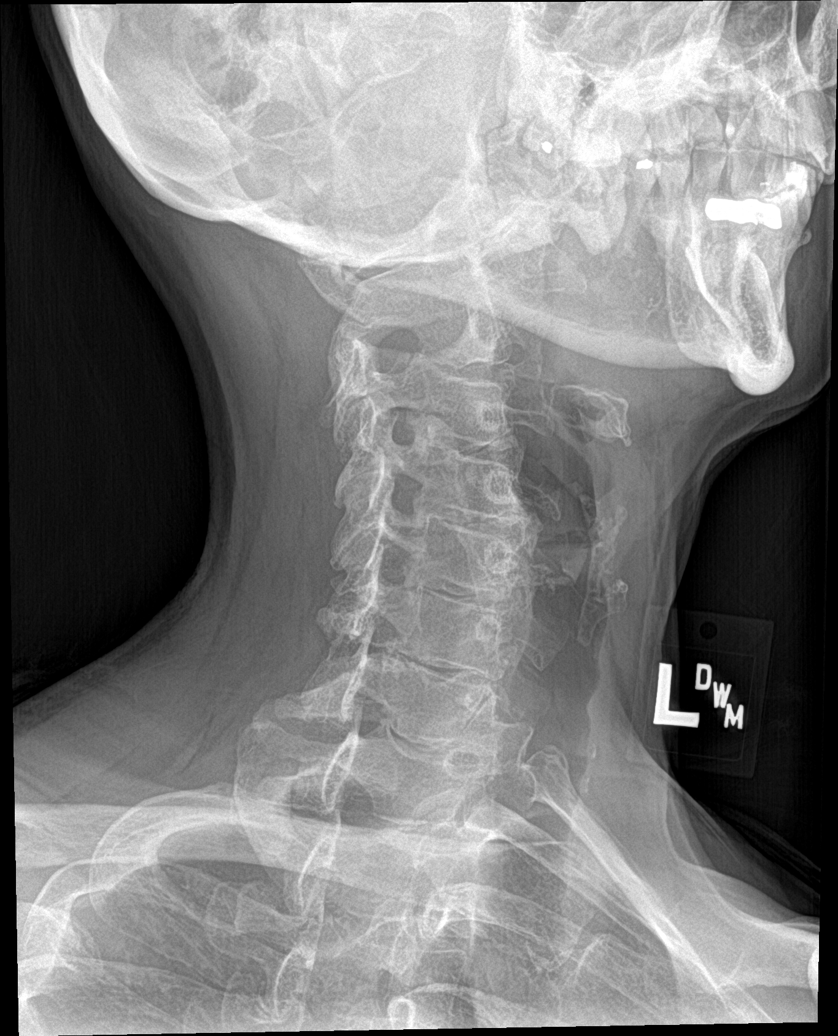

[c-spine obl (2 of 2)]
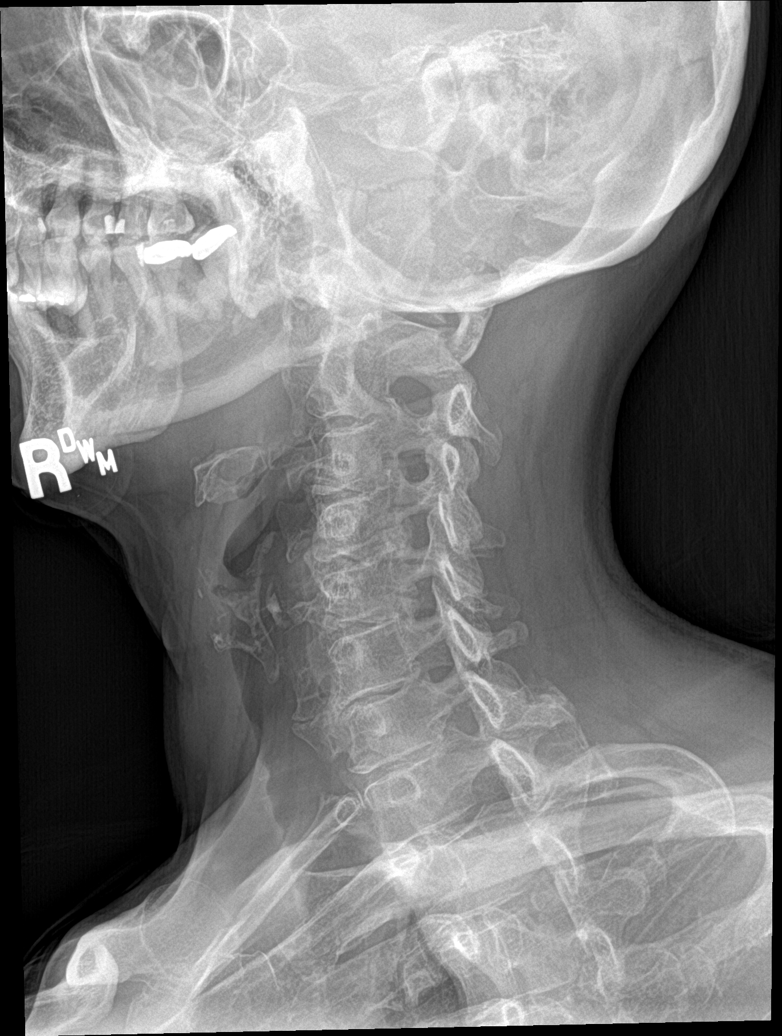

[c-spine ap]
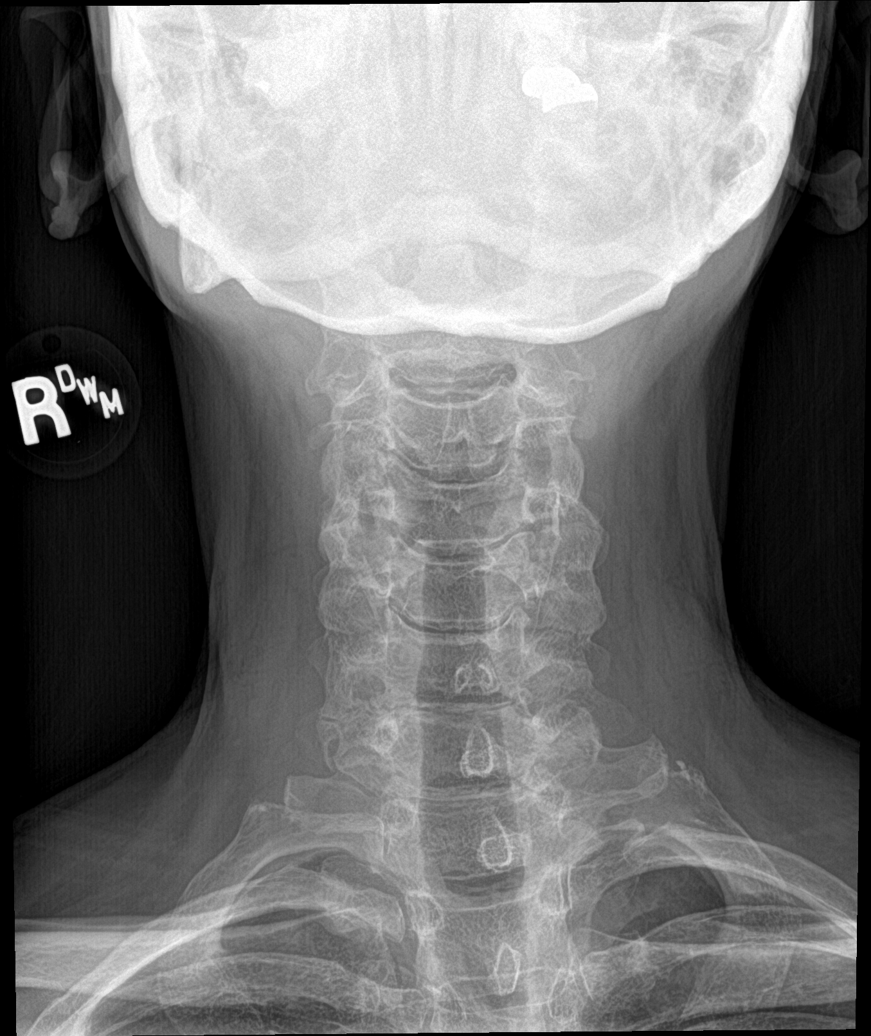

[c-spine open mouth]
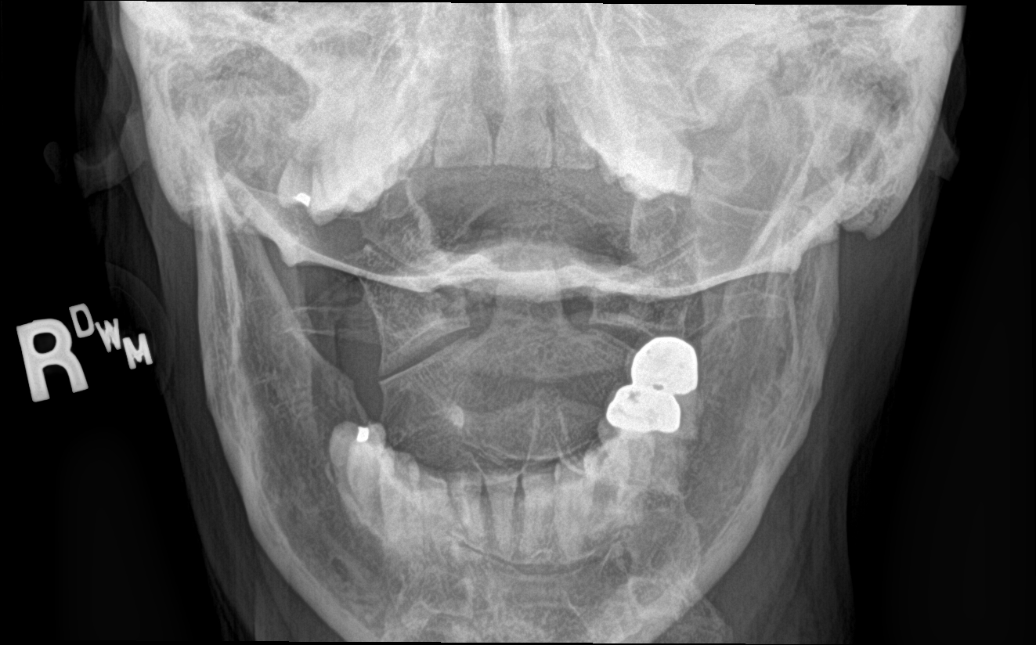

[[person_name]]
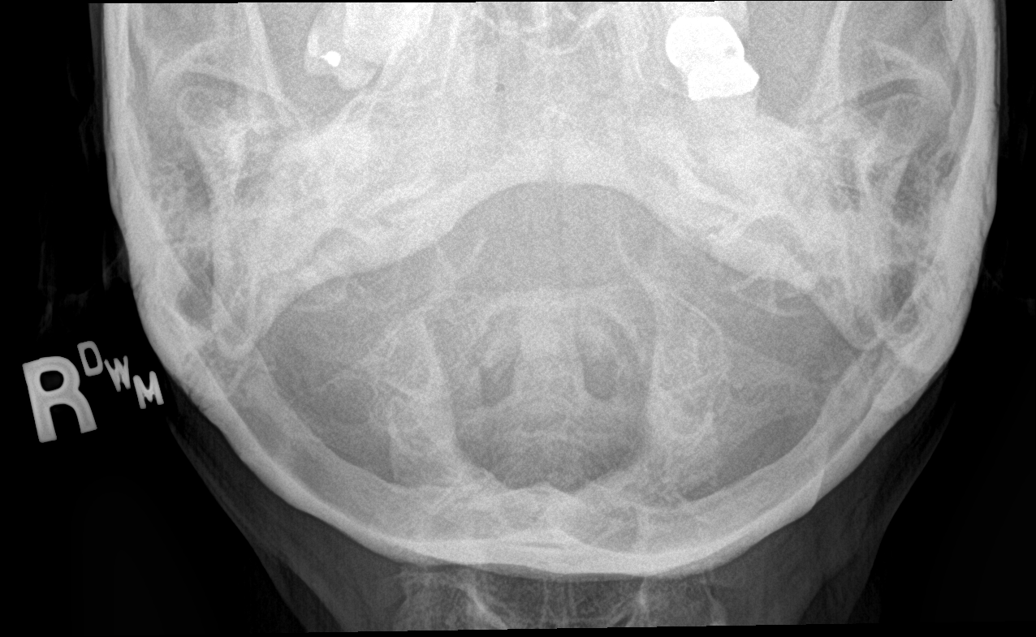

[6 of 6 positions shown; findings below may reference images not displayed]

FINDINGS: Alignment is normal. No prevertebral soft tissue abnormality. Disc
spaces and vertebral body heights are preserved. There is mild
multilevel facet hypertrophy without advanced foraminal narrowing.
IMPRESSION: Negative cervical spine radiographs.

## 2022-04-01 ENCOUNTER — Other Ambulatory Visit: Payer: Self-pay

## 2022-04-01 ENCOUNTER — Emergency Department (INDEPENDENT_AMBULATORY_CARE_PROVIDER_SITE_OTHER)
Admission: EM | Admit: 2022-04-01 | Discharge: 2022-04-01 | Disposition: A | Discharge status: Home / Self Care | Payer: BC Managed Care – PPO | Source: Home / Self Care

## 2022-04-01 DIAGNOSIS — B028 Zoster with other complications: Secondary | ICD-10-CM

## 2022-04-01 MED ORDER — VALACYCLOVIR HCL 1 G PO TABS: 1000.0000 mg | ORAL_TABLET | Freq: Three times a day (TID) | ORAL | 0 refills | Status: AC

## 2022-04-01 NOTE — Discharge Instructions (Addendum)
Advised patient to take medication as directed with food to completion.  Advised patient if rash or left arm pain worsens please follow-up with PCP or go to nearest emergency department for immediate evaluation.  Encouraged patient to increase daily water intake while taking this medication. ?

## 2022-04-01 NOTE — ED Triage Notes (Signed)
Pt presents to Urgent Care with c/o painful rash to L hand and forearm x 2 days. Reports recent injury of this arm related to fall approx 1 week ago. No new detergents, products reported.  ? ?

## 2022-04-01 NOTE — ED Provider Notes (Signed)
?[facility] ? ? ? ?CSN: [id number] ?Arrival date & time: 04/01/22  0908 ? ? ?  ? ?History   ?Chief Complaint ?Chief Complaint  ?Patient presents with  ? Rash  ? ? ?HPI ?Julie Pitts is a 61 y.o. female.  ? ?HPI 61 year old female presents with painful rash of left hand and left forearm for 2 days.  Reports recent injury of left forearm related to fall 1 week ago.  PMH significant for cervical spondylosis, neoplasm of skin, face, and severe headache.  Accompanied by her husband this morning. ? ?Past Medical History:  ?Diagnosis Date  ? Depression   ? ? ?Patient Active Problem List  ? Diagnosis Date Noted  ? Cervical spondylosis 03/12/2018  ? Chronic daily headache 03/08/2018  ? Severe headache 03/08/2018  ? Cervical pain 03/08/2018  ? Situational depression 03/08/2018  ? PLANTAR WART, RIGHT 02/09/2010  ? NEVUS, ATYPICAL 02/09/2010  ? NEOPLASM, SKIN, FACE 02/09/2010  ? ? ?Past Surgical History:  ?Procedure Laterality Date  ? CARPAL TUNNEL RELEASE    ? TUBAL LIGATION    ? WISDOM TOOTH EXTRACTION    ? ? ?OB History   ?No obstetric history on file. ?  ? ? ? ?Home Medications   ? ?Prior to Admission medications   ?Medication Sig Start Date End Date Taking? Authorizing Provider  ?valACYclovir (VALTREX) 1000 MG tablet Take 1 tablet (1,000 mg total) by mouth 3 (three) times daily for 7 days. 04/01/22 04/08/22 Yes Eliezer Lofts, FNP  ?cyclobenzaprine (FLEXERIL) 10 MG tablet One half tab PO qHS, then increase gradually to one tab TID. 03/12/18   Silverio Decamp, MD  ?fluticasone (FLONASE) 50 MCG/ACT nasal spray Place 2 sprays into both nostrils daily. 03/08/18   Hali Marry, MD  ?meloxicam (MOBIC) 15 MG tablet One tab PO qAM with breakfast for 2 weeks, then daily prn pain. 03/12/18   Silverio Decamp, MD  ?rizatriptan (MAXALT-MLT) 10 MG disintegrating tablet Take 1 tablet (10 mg total) by mouth as needed for migraine. May repeat in 2 hours if needed 03/08/18   Hali Marry, MD  ?topiramate  (TOPAMAX) 25 MG tablet 25 mg po QHS x 1 week, then increase to BID x 1 week, then 2 in AM and 1 QHS x 1 week, then 2 tabs po BID 03/08/18   Hali Marry, MD  ? ? ?Family History ?Family History  ?Problem Relation Age of Onset  ? Heart Problems Mother   ? ? ?Social History ?Social History  ? ?Tobacco Use  ? Smoking status: Former  ?  Packs/day: 1.00  ?  Years: 20.00  ?  Pack years: 20.00  ?  Types: Cigarettes  ?  Quit date: 03/08/1998  ?  Years since quitting: 24.0  ? Smokeless tobacco: Never  ?Vaping Use  ? Vaping Use: Never used  ?Substance Use Topics  ? Alcohol use: Never  ? Drug use: Never  ? ? ? ?Allergies   ?Patient has no known allergies. ? ? ?Review of Systems ?Review of Systems  ?Skin:  Positive for rash.  ? ? ?Physical Exam ?Triage Vital Signs ?ED Triage Vitals  ?Enc Vitals Group  ?   BP 04/01/22 0942 (!) 144/90  ?   Pulse Rate 04/01/22 0942 95  ?   Resp 04/01/22 0942 20  ?   Temp 04/01/22 0942 (!) 97.5 ?F (36.4 ?C)  ?   Temp src --   ?   SpO2 04/01/22 0942 97 %  ?  Weight 04/01/22 0944 140 lb (63.5 kg)  ?   Height 04/01/22 0944 '5\' 2"'$  (1.575 m)  ?   Head Circumference --   ?   Peak Flow --   ?   Pain Score 04/01/22 0944 8  ?   Pain Loc --   ?   Pain Edu? --   ?   Excl. in [place]? --   ? ?No data found. ? ?Updated Vital Signs ?BP (!) 144/90   Pulse 95   Temp (!) 97.5 ?F (36.4 ?C)   Resp 20   Ht '5\' 2"'$  (1.575 m)   Wt 140 lb (63.5 kg)   SpO2 97%   BMI 25.61 kg/m?  ? ? ?Physical Exam ?Vitals and nursing note reviewed.  ?Constitutional:   ?   Appearance: Normal appearance. She is normal weight.  ?HENT:  ?   Head: Normocephalic and atraumatic.  ?   Mouth/Throat:  ?   Mouth: Mucous membranes are moist.  ?   Pharynx: Oropharynx is clear.  ?Eyes:  ?   Extraocular Movements: Extraocular movements intact.  ?   Conjunctiva/sclera: Conjunctivae normal.  ?   Pupils: Pupils are equal, round, and reactive to light.  ?Cardiovascular:  ?   Rate and Rhythm: Normal rate and regular rhythm.  ?   Pulses: Normal pulses.   ?   Heart sounds: Normal heart sounds.  ?Pulmonary:  ?   Effort: Pulmonary effort is normal.  ?   Breath sounds: Normal breath sounds. No wheezing, rhonchi or rales.  ?Musculoskeletal:  ?   Cervical back: Normal range of motion and neck supple.  ?Skin: ?   General: Skin is warm and dry.  ?   Comments: Left lower arm/wrist/hand: Painful (described as burning pins-and-needles by patient) erythematous grouped maculopapular eruption with vesicular lesions noted  ?Neurological:  ?   General: No focal deficit present.  ?   Mental Status: She is alert and oriented to person, place, and time.  ? ? ? ?UC Treatments / Results  ?Labs ?(all labs ordered are listed, but only abnormal results are displayed) ?Labs Reviewed - No data to display ? ?EKG ? ? ?Radiology ?No results found. ? ?Procedures ?Procedures (including critical care time) ? ?Medications Ordered in UC ?Medications - No data to display ? ?Initial Impression / Assessment and Plan / UC Course  ?I have reviewed the triage vital signs and the nursing notes. ? ?Pertinent labs & imaging results that were available during my care of the patient were reviewed by me and considered in my medical decision making (see chart for details). ? ?  ? ?MDM: 1.  Herpes zoster with other complication-Advised patient to take medication as directed with food to completion.  Advised patient if rash or left arm pain worsens please follow-up with PCP or go to nearest emergency department for immediate evaluation.  Encouraged patient to increase daily water intake while taking this medication.  Patient discharged home, hemodynamically stable. ?Final Clinical Impressions(s) / UC Diagnoses  ? ?Final diagnoses:  ?Herpes zoster with other complication  ? ? ? ?Discharge Instructions   ? ?  ?Advised patient to take medication as directed with food to completion.  Advised patient if rash or left arm pain worsens please follow-up with PCP or go to nearest emergency department for immediate  evaluation.  Encouraged patient to increase daily water intake while taking this medication. ? ? ? ? ?ED Prescriptions   ? ? Medication Sig Dispense Auth. Provider  ? valACYclovir (VALTREX)  1000 MG tablet Take 1 tablet (1,000 mg total) by mouth 3 (three) times daily for 7 days. 21 tablet Eliezer Lofts, FNP  ? ?  ? ?PDMP not reviewed this encounter. ?  ?Eliezer Lofts, [place] ?04/01/22 1048 ? ?
